# Patient Record
Sex: Male | Born: 1980 | Race: Black or African American | Hispanic: No | Marital: Married | Smoking: Never smoker
Health system: Southern US, Community
[De-identification: ages and names within clinical notes are randomized; demographics above are authoritative.]

---

## 2015-10-29 ENCOUNTER — Emergency Department (HOSPITAL_COMMUNITY)
Admission: EM | Admit: 2015-10-29 | Discharge: 2015-10-29 | Disposition: A | Discharge status: Home / Self Care | Payer: BLUE CROSS/BLUE SHIELD | Attending: Emergency Medicine | Admitting: Emergency Medicine

## 2015-10-29 ENCOUNTER — Encounter (HOSPITAL_COMMUNITY): Payer: Self-pay | Admitting: *Deleted

## 2015-10-29 DIAGNOSIS — Y9289 Other specified places as the place of occurrence of the external cause: Secondary | ICD-10-CM | POA: Insufficient documentation

## 2015-10-29 DIAGNOSIS — X501XXA Overexertion from prolonged static or awkward postures, initial encounter: Secondary | ICD-10-CM | POA: Diagnosis not present

## 2015-10-29 DIAGNOSIS — Y9389 Activity, other specified: Secondary | ICD-10-CM | POA: Insufficient documentation

## 2015-10-29 DIAGNOSIS — Y998 Other external cause status: Secondary | ICD-10-CM | POA: Diagnosis not present

## 2015-10-29 DIAGNOSIS — S3992XA Unspecified injury of lower back, initial encounter: Secondary | ICD-10-CM | POA: Insufficient documentation

## 2015-10-29 DIAGNOSIS — M549 Dorsalgia, unspecified: Secondary | ICD-10-CM

## 2015-10-29 MED ORDER — IBUPROFEN 800 MG PO TABS: 800.0000 mg | ORAL_TABLET | Freq: Three times a day (TID) | ORAL | Status: DC

## 2015-10-29 MED ORDER — IBUPROFEN 400 MG PO TABS
800.0000 mg | ORAL_TABLET | Freq: Once | ORAL | Status: AC
  Administered 2015-10-29: 800 mg via ORAL
  Filled 2015-10-29: qty 2

## 2015-10-29 MED ORDER — METHOCARBAMOL 500 MG PO TABS: 500.0000 mg | ORAL_TABLET | Freq: Two times a day (BID) | ORAL | Status: DC

## 2015-10-29 MED ORDER — METHOCARBAMOL 500 MG PO TABS
500.0000 mg | ORAL_TABLET | Freq: Once | ORAL | Status: AC
  Administered 2015-10-29: 500 mg via ORAL
  Filled 2015-10-29: qty 1

## 2015-10-29 NOTE — ED Notes (Signed)
Declined W/C at D/C and was escorted to lobby by RN. 

## 2015-10-29 NOTE — Discharge Instructions (Signed)
1. Medications: motrin, robaxin, usual home medications 2. Treatment: rest, drink plenty of fluids, use heating pads, do back exercises 3. Follow Up: please followup with your primary doctor this week for discussion of your diagnoses and further evaluation after today's visit; if you do not have a primary care doctor use the resource guide provided to find one; please return to the ER for increased pain, numbness, loss of control of your bowel or bladder, new or worsening symptoms    Back Pain, Adult Back pain is very common. The pain often gets better over time. The cause of back pain is usually not dangerous. Most people can learn to manage their back pain on their own.  HOME CARE  Watch your back pain for any changes. The following actions may help to lessen any pain you are feeling: 1. Stay active. Start with short walks on flat ground if you can. Try to walk farther each day. 2. Exercise regularly as told by your doctor. Exercise helps your back heal faster. It also helps avoid future injury by keeping your muscles strong and flexible. 3. Do not sit, drive, or stand in one place for more than 30 minutes. 4. Do not stay in bed. Resting more than 1-2 days can slow down your recovery. 5. Be careful when you bend or lift an object. Use good form when lifting: 1. Bend at your knees. 2. Keep the object close to your body. 3. Do not twist. 6. Sleep on a firm mattress. Lie on your side, and bend your knees. If you lie on your back, put a pillow under your knees. 7. Take medicines only as told by your doctor. 8. Put ice on the injured area. 1. Put ice in a plastic bag. 2. Place a towel between your skin and the bag. 3. Leave the ice on for 20 minutes, 2-3 times a day for the first 2-3 days. After that, you can switch between ice and heat packs. 9. Avoid feeling anxious or stressed. Find good ways to deal with stress, such as exercise. 10. Maintain a healthy weight. Extra weight puts stress on  your back. GET HELP IF:  1. You have pain that does not go away with rest or medicine. 2. You have worsening pain that goes down into your legs or buttocks. 3. You have pain that does not get better in one week. 4. You have pain at night. 5. You lose weight. 6. You have a fever or chills. GET HELP RIGHT AWAY IF:  1. You cannot control when you poop (bowel movement) or pee (urinate). 2. Your arms or legs feel weak. 3. Your arms or legs lose feeling (numbness). 4. You feel sick to your stomach (nauseous) or throw up (vomit). 5. You have belly (abdominal) pain. 6. You feel like you may pass out (faint).   This information is not intended to replace advice given to you by your health care provider. Make sure you discuss any questions you have with your health care provider.   Document Released: 02/04/2008 Document Revised: 09/08/2014 Document Reviewed: 12/20/2013 Elsevier Interactive Patient Education 2016 [employer]  Back Exercises If you have pain in your back, do these exercises 2-3 times each day or as told by your doctor. When the pain goes away, do the exercises once each day, but repeat the steps more times for each exercise (do more repetitions). If you do not have pain in your back, do these exercises once each day or as told by your  doctor. EXERCISES Single Knee to Chest Do these steps 3-5 times in a row for each leg: 11. Lie on your back on a firm bed or the floor with your legs stretched out. 12. Bring one knee to your chest. 13. Hold your knee to your chest by grabbing your knee or thigh. 14. Pull on your knee until you feel a gentle stretch in your lower back. 15. Keep doing the stretch for 10-30 seconds. 16. Slowly let go of your leg and straighten it. Pelvic Tilt Do these steps 5-10 times in a row: 7. Lie on your back on a firm bed or the floor with your legs stretched out. 8. Bend your knees so they point up to the ceiling. Your feet should be flat on the  floor. 9. Tighten your lower belly (abdomen) muscles to press your lower back against the floor. This will make your tailbone point up to the ceiling instead of pointing down to your feet or the floor. 10. Stay in this position for 5-10 seconds while you gently tighten your muscles and breathe evenly. Cat-Cow Do these steps until your lower back bends more easily: 7. Get on your hands and knees on a firm surface. Keep your hands under your shoulders, and keep your knees under your hips. You may put padding under your knees. 8. Let your head hang down, and make your tailbone point down to the floor so your lower back is round like the back of a cat. 9. Stay in this position for 5 seconds. 10. Slowly lift your head and make your tailbone point up to the ceiling so your back hangs low (sags) like the back of a cow. 11. Stay in this position for 5 seconds. Press-Ups Do these steps 5-10 times in a row: 1. Lie on your belly (face-down) on the floor. 2. Place your hands near your head, about shoulder-width apart. 3. While you keep your back relaxed and keep your hips on the floor, slowly straighten your arms to raise the top half of your body and lift your shoulders. Do not use your back muscles. To make yourself more comfortable, you may change where you place your hands. 4. Stay in this position for 5 seconds. 5. Slowly return to lying flat on the floor. Bridges Do these steps 10 times in a row: 1. Lie on your back on a firm surface. 2. Bend your knees so they point up to the ceiling. Your feet should be flat on the floor. 3. Tighten your butt muscles and lift your butt off of the floor until your waist is almost as high as your knees. If you do not feel the muscles working in your butt and the back of your thighs, slide your feet 1-2 inches farther away from your butt. 4. Stay in this position for 3-5 seconds. 5. Slowly lower your butt to the floor, and let your butt muscles relax. If this  exercise is too easy, try doing it with your arms crossed over your chest. Belly Crunches Do these steps 5-10 times in a row: 1. Lie on your back on a firm bed or the floor with your legs stretched out. 2. Bend your knees so they point up to the ceiling. Your feet should be flat on the floor. 3. Cross your arms over your chest. 4. Tip your chin a little bit toward your chest but do not bend your neck. 5. Tighten your belly muscles and slowly raise your chest just enough to lift  your shoulder blades a tiny bit off of the floor. 6. Slowly lower your chest and your head to the floor. Back Lifts Do these steps 5-10 times in a row: 1. Lie on your belly (face-down) with your arms at your sides, and rest your forehead on the floor. 2. Tighten the muscles in your legs and your butt. 3. Slowly lift your chest off of the floor while you keep your hips on the floor. Keep the back of your head in line with the curve in your back. Look at the floor while you do this. 4. Stay in this position for 3-5 seconds. 5. Slowly lower your chest and your face to the floor. GET HELP IF:  Your back pain gets a lot worse when you do an exercise.  Your back pain does not lessen 2 hours after you exercise. If you have any of these problems, stop doing the exercises. Do not do them again unless your doctor says it is okay. GET HELP RIGHT AWAY IF:  You have sudden, very bad back pain. If this happens, stop doing the exercises. Do not do them again unless your doctor says it is okay.   This information is not intended to replace advice given to you by your health care provider. Make sure you discuss any questions you have with your health care provider.   Document Released: 09/20/2010 Document Revised: 05/09/2015 Document Reviewed: 10/12/2014 Elsevier Interactive Patient Education [employer]

## 2015-10-29 NOTE — ED Provider Notes (Signed)
CSN: [id number]     Arrival date & time 10/29/15  1249 History  By signing my name below, I, Soijett Blue, attest that this documentation has been prepared under the direction and in the presence of Glean Hess, PA-C Electronically Signed: Soijett Blue, ED Scribe. 10/29/2015. 3:06 PM.   Chief Complaint  Patient presents with  . Back Pain     The history is provided by the patient. No language interpreter was used.    HPI Comments: Erik Adkins is a 35 y.o. male who presents to the Emergency Department complaining of 9/10, constant, sharp, non-radiating, right lower back pain onset 4 days. Pt notes that he attempted to pick his son up out of a bathtub when his pain started. Pt back pain is worsened with movement and ambulation. Pt denies any alleviating factors. Pt has had back issues in the past to the same area that was caused by a sports injury. Pt past back pain was treated with muscle relaxers. Pt notes that his current back pain episode is worse than his past back pain episode. He states that he is having associated symptoms of tingling to bilateral legs and generalized body aches. He states that he has tried ibuprofen with mild relief for his symptoms. Pt denies bowel/bladder incontinence, saddle anesthesia, numbness, weakness, abdominal pain, n/v, and any other symptoms. Denies PMHx of CA, blood thinner use, or IV drug use. Pt does have a PCP.     History reviewed. No pertinent past medical history. History reviewed. No pertinent past surgical history. No family history on file. Social History  Substance Use Topics  . Smoking status: Never Smoker   . Smokeless tobacco: None  . Alcohol Use: Yes     Comment: occ      Review of Systems  Gastrointestinal: Negative for nausea, vomiting and abdominal pain.       No bowel incontinence  Genitourinary:       No bladder incontinence  Musculoskeletal: Positive for myalgias, back pain and gait problem (due to pain).  Skin:  Negative for color change, rash and wound.  Neurological: Negative for weakness and numbness.       Tingling to bilateral legs      Allergies  Review of patient's allergies indicates no known allergies.  Home Medications   Prior to Admission medications   Medication Sig Start Date End Date Taking? Authorizing Provider  ibuprofen (ADVIL,MOTRIN) 800 MG tablet Take 1 tablet (800 mg total) by mouth 3 (three) times daily. 10/29/15   Mady Gemma, PA-C  methocarbamol (ROBAXIN) 500 MG tablet Take 1 tablet (500 mg total) by mouth 2 (two) times daily. 10/29/15   Mady Gemma, PA-C    BP 128/68 mmHg  Pulse 57  Temp(Src) 98.2 F (36.8 C) (Oral)  Resp 18  SpO2 99% Physical Exam  Constitutional: He is oriented to person, place, and time. He appears well-developed and well-nourished. No distress.  HENT:  Head: Normocephalic and atraumatic.  Right Ear: External ear normal.  Left Ear: External ear normal.  Nose: Nose normal.  Eyes: Conjunctivae and EOM are normal. Right eye exhibits no discharge. Left eye exhibits no discharge. No scleral icterus.  Neck: Normal range of motion. Neck supple.  Cardiovascular: Normal rate, regular rhythm, normal heart sounds and intact distal pulses.   Pulmonary/Chest: Effort normal and breath sounds normal. No respiratory distress. He has no wheezes. He has no rales. He exhibits no tenderness.  Abdominal: Soft. Bowel sounds are normal. He exhibits no distension  and no mass. There is no tenderness. There is no rebound and no guarding.  Musculoskeletal: Normal range of motion. He exhibits tenderness. He exhibits no edema.       Lumbar back: He exhibits tenderness. He exhibits no deformity.  TTP to right lumbar paraspinal muscles. No midline tenderness, step-off, or deformities.  Neurological: He is alert and oriented to person, place, and time. He has normal strength and normal reflexes. No sensory deficit. Gait normal.  Strength and sensation  intact. DTRs intact. Patient able to ambulate without difficulty or assistance.   Skin: Skin is warm and dry. He is not diaphoretic.  Psychiatric: He has a normal mood and affect. His behavior is normal.  Nursing note and vitals reviewed.   ED Course  Procedures (including critical care time)  DIAGNOSTIC STUDIES: Oxygen Saturation is 99% on RA, nl by my interpretation.    COORDINATION OF CARE: 3:05 PM Discussed treatment plan with pt at bedside which includes ibuprofen rx, muscle relaxer rx, and pt agreed to plan.   Labs Review Labs Reviewed - No data to display  Imaging Review No results found.    EKG Interpretation None      MDM   Final diagnoses:  Back pain, unspecified location    35 year old male presents with back pain after bending over to pick up his son. Reports tingling to lower extremities bilaterally. Denies numbness, weakness, bowel or bladder incontinence, saddle anesthesia, history of malignancy, IVDU, anticoagulant use.  Patient is afebrile. Vital signs stable. On exam, he has TTP to his right lumbar paraspinal muscles with palpable spasm. No midline tenderness, step-off, or deformity. Strength, sensation, DTRs intact. Patient ambulates without difficulty.  Do not feel imaging is indicated at this time. Doubt cauda equina, abscess, hematoma. Symptoms likely muscular. Will treat with ibuprofen and robaxin. Patient to follow-up with PCP. Strict return precautions discussed. Patient verbalizes his understanding and is in agreement with plan.  BP 128/68 mmHg  Pulse 57  Temp(Src) 98.2 F (36.8 C) (Oral)  Resp 18  SpO2 99%   I personally performed the services described in this documentation, which was scribed in my presence. The recorded information has been reviewed and is accurate.     Mady Gemma, PA-C 10/29/15 1521  Lyndal Pulley, MD 10/29/15 [phone]

## 2015-10-29 NOTE — ED Notes (Signed)
Pt states went to pick son up out of bathtub on Fri.  His legs gave out, but he was able to get up and has been experiencing constant back pain since then.  No loss of bowel or bladder habits.

## 2016-03-21 DIAGNOSIS — M545 Low back pain: Secondary | ICD-10-CM | POA: Diagnosis not present

## 2016-03-21 DIAGNOSIS — J020 Streptococcal pharyngitis: Secondary | ICD-10-CM | POA: Diagnosis not present

## 2016-07-01 ENCOUNTER — Encounter (HOSPITAL_COMMUNITY): Payer: Self-pay | Admitting: Emergency Medicine

## 2016-07-01 ENCOUNTER — Emergency Department (HOSPITAL_COMMUNITY)
Admission: EM | Admit: 2016-07-01 | Discharge: 2016-07-01 | Disposition: A | Discharge status: Home / Self Care | Payer: BLUE CROSS/BLUE SHIELD | Attending: Emergency Medicine | Admitting: Emergency Medicine

## 2016-07-01 ENCOUNTER — Emergency Department (HOSPITAL_COMMUNITY): Payer: BLUE CROSS/BLUE SHIELD

## 2016-07-01 DIAGNOSIS — R51 Headache: Secondary | ICD-10-CM | POA: Insufficient documentation

## 2016-07-01 DIAGNOSIS — M545 Low back pain, unspecified: Secondary | ICD-10-CM

## 2016-07-01 DIAGNOSIS — R519 Headache, unspecified: Secondary | ICD-10-CM

## 2016-07-01 LAB — URINALYSIS, ROUTINE W REFLEX MICROSCOPIC
Bilirubin Urine: NEGATIVE
GLUCOSE, UA: NEGATIVE mg/dL
Hgb urine dipstick: NEGATIVE
Ketones, ur: NEGATIVE mg/dL
Leukocytes, UA: NEGATIVE
Nitrite: NEGATIVE
PH: 7 (ref 5.0–8.0)
Protein, ur: NEGATIVE mg/dL
SPECIFIC GRAVITY, URINE: 1.016 (ref 1.005–1.030)

## 2016-07-01 IMAGING — CT CT ANGIO HEAD
1 of 13 series · 1 of 33 positions shown · IV contrast (Iohexol (Omnipaque 350))
Comparison: None.

CLINICAL DATA: Right-sided headache and photophobia acute onset.

EXAM:
CT ANGIOGRAPHY HEAD
TECHNIQUE: Multidetector CT imaging of the head was performed using the
standard protocol during bolus administration of intravenous
contrast. Multiplanar CT image reconstructions and MIPs were
obtained to evaluate the vascular anatomy.
CONTRAST:  50 cc Isovue 370

[Series 300: locator · axial · 0.49mm/px · 1 of 1 slices shown]
[im 1/1  soft-tissue]
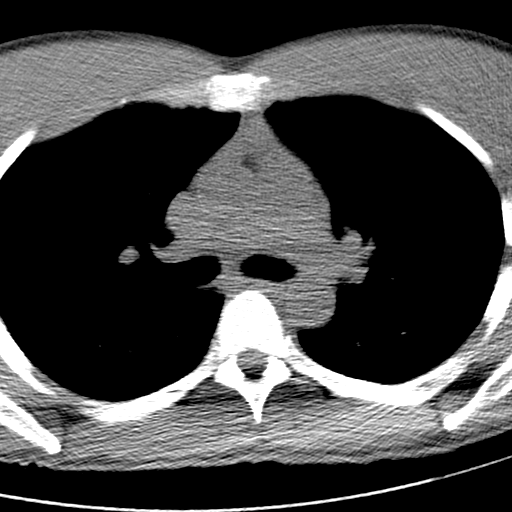

[1 of 33 positions shown; findings below may reference images not displayed]

FINDINGS: CT HEAD

Brain: No evidence of malformation, atrophy, old or acute small or
large vessel infarction, mass lesion, hemorrhage, hydrocephalus or
extra-axial collection. No evidence of pituitary lesion.

Vascular: No vascular calcification.  No hyperdense vessels.

Skull: Normal.  No fracture or focal bone lesion.

Sinuses/Orbits: Visualized sinuses are clear. No fluid in the middle
ears or mastoids. Visualized orbits are normal.

Other: None significant

CTA HEAD

Anterior circulation: Both internal carotid arteries are widely
patent through the skullbase and siphon regions. The anterior and
middle cerebral vessels are normal without stenosis, aneurysm or
vascular malformation.

Posterior circulation: Both vertebral arteries are patent to the
basilar. No basilar stenosis. Posterior circulation branch vessels
are normal.

Venous sinuses: Patent and normal.

Anatomic variants: None significant.

Delayed phase: No abnormal enhancement.
IMPRESSION: Normal CT of the head with and without contrast. No evidence of
intracranial hemorrhage.

Normal CT angiography of the intracranial vasculature.

## 2016-07-01 MED ORDER — PROCHLORPERAZINE EDISYLATE 5 MG/ML IJ SOLN
10.0000 mg | Freq: Once | INTRAMUSCULAR | Status: AC
  Administered 2016-07-01: 10 mg via INTRAVENOUS
  Filled 2016-07-01: qty 2

## 2016-07-01 MED ORDER — IOPAMIDOL (ISOVUE-370) INJECTION 76%
INTRAVENOUS | Status: AC
  Administered 2016-07-01: 50 mL
  Filled 2016-07-01: qty 100

## 2016-07-01 MED ORDER — METHOCARBAMOL 500 MG PO TABS: 500.0000 mg | ORAL_TABLET | Freq: Three times a day (TID) | ORAL | 0 refills | Status: DC | PRN

## 2016-07-01 MED ORDER — KETOROLAC TROMETHAMINE 30 MG/ML IJ SOLN
30.0000 mg | Freq: Once | INTRAMUSCULAR | Status: AC
  Administered 2016-07-01: 30 mg via INTRAVENOUS
  Filled 2016-07-01: qty 1

## 2016-07-01 MED ORDER — IBUPROFEN 800 MG PO TABS: 800.0000 mg | ORAL_TABLET | Freq: Three times a day (TID) | ORAL | 0 refills | Status: AC | PRN

## 2016-07-01 MED ORDER — DIPHENHYDRAMINE HCL 50 MG/ML IJ SOLN
25.0000 mg | Freq: Once | INTRAMUSCULAR | Status: AC
  Administered 2016-07-01: 25 mg via INTRAVENOUS
  Filled 2016-07-01: qty 1

## 2016-07-01 MED ORDER — SODIUM CHLORIDE 0.9 % IV BOLUS (SEPSIS)
1000.0000 mL | Freq: Once | INTRAVENOUS | Status: AC
  Administered 2016-07-01: 1000 mL via INTRAVENOUS

## 2016-07-01 MED ORDER — DEXAMETHASONE SODIUM PHOSPHATE 10 MG/ML IJ SOLN
10.0000 mg | Freq: Once | INTRAMUSCULAR | Status: AC
  Administered 2016-07-01: 10 mg via INTRAVENOUS
  Filled 2016-07-01: qty 1

## 2016-07-01 NOTE — ED Provider Notes (Signed)
[facility]-EMERGENCY DEPT Provider Note   CSN: [id number] Arrival date & time: 07/01/16  [id number]     History   Chief Complaint Chief Complaint  Patient presents with  . Back Pain  . Headache    HPI Erik BlanchBrian Kreischer is a 35 y.o. male.  35 year old male here with a couple weeks of intermittent headache since associated with photophobia. Also has had some right lower back pain. Has some dark and decreased urine over the last 24 hours. No fevers, nausea, vomiting. Has had a "stiff neck" which seems to be bilateral muscular with nothing midline. Has no trouble moving his neck. No trouble swallowing or breathing. No rashes, trauma or other associated symptoms. No modifying factors side from light make it worse. Has taken Motrin to some relief.      History reviewed. No pertinent past medical history.  There are no active problems to display for this patient.   History reviewed. No pertinent surgical history.     Home Medications    Prior to Admission medications   Medication Sig Start Date End Date Taking? Authorizing Provider  ibuprofen (ADVIL,MOTRIN) 800 MG tablet Take 1 tablet (800 mg total) by mouth every 8 (eight) hours as needed. 07/01/16   Marily MemosJason Keria Widrig, MD  methocarbamol (ROBAXIN) 500 MG tablet Take 1 tablet (500 mg total) by mouth every 8 (eight) hours as needed for muscle spasms. 07/01/16   Marily MemosJason Tatyana Biber, MD    Family History No family history on file.  Social History Social History  Substance Use Topics  . Smoking status: Never Smoker  . Smokeless tobacco: Not on file  . Alcohol use Yes     Comment: occ     Allergies   Review of patient's allergies indicates no known allergies.   Review of Systems Review of Systems  All other systems reviewed and are negative.    Physical Exam Updated Vital Signs BP 105/68   Pulse (!) 47   Temp 98.2 F (36.8 C) (Oral)   Resp 14   Ht 6\' 3"  (1.905 m)   Wt 202 lb (91.6 kg)   SpO2 100%   BMI 25.25 kg/m   Physical  Exam  Constitutional: He appears well-developed and well-nourished.  HENT:  Head: Normocephalic and atraumatic.  Eyes: Conjunctivae and EOM are normal.  Neck: Normal range of motion.  Cardiovascular: Normal rate.   Pulmonary/Chest: Effort normal. No respiratory distress. He exhibits no tenderness.  Abdominal: Soft. He exhibits no distension. There is no tenderness.  Musculoskeletal: Normal range of motion. He exhibits tenderness (mild right lumbar).  Neurological: He is alert.  No altered mental status, able to give full seemingly accurate history.  Face is symmetric, EOM's intact, pupils equal and reactive, vision intact, tongue and uvula midline without deviation Upper and Lower extremity motor 5/5, intact pain perception in distal extremities, 2+ reflexes in biceps, patella and achilles tendons. Finger to nose normal, heel to shin normal. Walks without assistance or evident ataxia.    Skin: Skin is warm and dry. No rash noted.  Nursing note and vitals reviewed.    ED Treatments / Results  Labs (all labs ordered are listed, but only abnormal results are displayed) Labs Reviewed  URINALYSIS, ROUTINE W REFLEX MICROSCOPIC [facility])    EKG  EKG Interpretation None       Radiology Ct Angio Head W Or Wo Contrast  Result Date: 07/01/2016 CLINICAL DATA:  Right-sided headache and photophobia acute onset. EXAM: CT ANGIOGRAPHY HEAD TECHNIQUE: Multidetector CT imaging of  the head was performed using the standard protocol during bolus administration of intravenous contrast. Multiplanar CT image reconstructions and MIPs were obtained to evaluate the vascular anatomy. CONTRAST:  50 cc Isovue 370 COMPARISON:  None. FINDINGS: CT HEAD Brain: No evidence of malformation, atrophy, old or acute small or large vessel infarction, mass lesion, hemorrhage, hydrocephalus or extra-axial collection. No evidence of pituitary lesion. Vascular: No vascular calcification.  No hyperdense vessels. Skull:  Normal.  No fracture or focal bone lesion. Sinuses/Orbits: Visualized sinuses are clear. No fluid in the middle ears or mastoids. Visualized orbits are normal. Other: None significant CTA HEAD Anterior circulation: Both internal carotid arteries are widely patent through the skullbase and siphon regions. The anterior and middle cerebral vessels are normal without stenosis, aneurysm or vascular malformation. Posterior circulation: Both vertebral arteries are patent to the basilar. No basilar stenosis. Posterior circulation branch vessels are normal. Venous sinuses: Patent and normal. Anatomic variants: None significant. Delayed phase: No abnormal enhancement. IMPRESSION: Normal CT of the head with and without contrast. No evidence of intracranial hemorrhage. Normal CT angiography of the intracranial vasculature. Electronically Signed   By: Paulina FusiMark  Shogry M.D.   On: 07/01/2016 09:36    Procedures Procedures (including critical care time)  Medications Ordered in ED Medications  sodium chloride 0.9 % bolus 1,000 mL (0 mLs Intravenous Stopped 07/01/16 1000)  diphenhydrAMINE (BENADRYL) injection 25 mg (25 mg Intravenous Given 07/01/16 0823)  prochlorperazine (COMPAZINE) injection 10 mg (10 mg Intravenous Given 07/01/16 0824)  ketorolac (TORADOL) 30 MG/ML injection 30 mg (30 mg Intravenous Given 07/01/16 0823)  dexamethasone (DECADRON) injection 10 mg (10 mg Intravenous Given 07/01/16 0824)  iopamidol (ISOVUE-370) 76 % injection (50 mLs  Contrast Given 07/01/16 0845)     Initial Impression / Assessment and Plan / ED Course  I have reviewed the triage vital signs and the nursing notes.  Pertinent labs & imaging results that were available during my care of the patient were reviewed by me and considered in my medical decision making (see chart for details).  Clinical Course   Likely migraines 2/2 stress/dehydration so will treat accordingly. However relatively new onset headaches and has a family  history of aneurysms so we'll get a CTA to ensure no aneurysm.  Labs and imaging unremarkable. Improved symptoms. Doubt meningitis, aneurysm or hemorrhage.   Final Clinical Impressions(s) / ED Diagnoses   Final diagnoses:  Right-sided low back pain without sciatica, unspecified chronicity  Nonintractable headache, unspecified chronicity pattern, unspecified headache type    New Prescriptions Discharge Medication List as of 07/01/2016 10:30 AM       Marily MemosJason Niaja Stickley, MD 07/01/16 [phone]

## 2016-07-01 NOTE — ED Triage Notes (Signed)
Pt reports headache and neck stiffness x2 weeks. Pt denies any fevers or n/v. Pt also reports lower back pain that began this morning, nad. Pt a/ox4.

## 2016-07-01 NOTE — ED Notes (Signed)
Patient transported to CT 

## 2016-08-20 ENCOUNTER — Other Ambulatory Visit (HOSPITAL_COMMUNITY): Payer: Self-pay | Admitting: Family Medicine

## 2016-08-20 DIAGNOSIS — M545 Low back pain: Secondary | ICD-10-CM

## 2016-08-27 ENCOUNTER — Ambulatory Visit (HOSPITAL_COMMUNITY): Admission: RE | Admit: 2016-08-27 | Payer: BLUE CROSS/BLUE SHIELD | Source: Ambulatory Visit

## 2017-05-18 DIAGNOSIS — S46812A Strain of other muscles, fascia and tendons at shoulder and upper arm level, left arm, initial encounter: Secondary | ICD-10-CM | POA: Diagnosis not present

## 2017-05-20 ENCOUNTER — Other Ambulatory Visit (HOSPITAL_COMMUNITY): Payer: Self-pay | Admitting: Family Medicine

## 2017-05-20 DIAGNOSIS — M545 Low back pain: Secondary | ICD-10-CM

## 2017-05-21 ENCOUNTER — Ambulatory Visit (HOSPITAL_COMMUNITY)
Admission: RE | Admit: 2017-05-21 | Discharge: 2017-05-21 | Disposition: A | Discharge status: Home / Self Care | Payer: BLUE CROSS/BLUE SHIELD | Source: Ambulatory Visit | Attending: Family Medicine | Admitting: Family Medicine

## 2017-05-21 DIAGNOSIS — M5137 Other intervertebral disc degeneration, lumbosacral region: Secondary | ICD-10-CM | POA: Diagnosis not present

## 2017-05-21 DIAGNOSIS — M5127 Other intervertebral disc displacement, lumbosacral region: Secondary | ICD-10-CM | POA: Diagnosis not present

## 2017-05-21 DIAGNOSIS — M545 Low back pain: Secondary | ICD-10-CM

## 2017-05-21 DIAGNOSIS — M5116 Intervertebral disc disorders with radiculopathy, lumbar region: Secondary | ICD-10-CM | POA: Diagnosis not present

## 2017-05-25 DIAGNOSIS — Z6825 Body mass index (BMI) 25.0-25.9, adult: Secondary | ICD-10-CM | POA: Diagnosis not present

## 2017-05-25 DIAGNOSIS — M549 Dorsalgia, unspecified: Secondary | ICD-10-CM | POA: Diagnosis not present

## 2018-02-23 DIAGNOSIS — J020 Streptococcal pharyngitis: Secondary | ICD-10-CM | POA: Diagnosis not present

## 2018-02-23 DIAGNOSIS — J029 Acute pharyngitis, unspecified: Secondary | ICD-10-CM | POA: Diagnosis not present

## 2018-08-19 DIAGNOSIS — J00 Acute nasopharyngitis [common cold]: Secondary | ICD-10-CM | POA: Diagnosis not present

## 2019-03-01 DIAGNOSIS — Z20828 Contact with and (suspected) exposure to other viral communicable diseases: Secondary | ICD-10-CM | POA: Diagnosis not present

## 2019-05-30 ENCOUNTER — Other Ambulatory Visit: Payer: Self-pay

## 2019-05-30 DIAGNOSIS — Z20822 Contact with and (suspected) exposure to covid-19: Secondary | ICD-10-CM

## 2019-05-31 LAB — NOVEL CORONAVIRUS, NAA: SARS-CoV-2, NAA: NOT DETECTED

## 2019-06-16 ENCOUNTER — Other Ambulatory Visit: Payer: Self-pay

## 2019-06-16 DIAGNOSIS — Z20822 Contact with and (suspected) exposure to covid-19: Secondary | ICD-10-CM

## 2019-06-18 LAB — NOVEL CORONAVIRUS, NAA: SARS-CoV-2, NAA: NOT DETECTED

## 2019-09-07 ENCOUNTER — Ambulatory Visit: Payer: BC Managed Care – PPO | Attending: Internal Medicine

## 2019-09-07 DIAGNOSIS — Z20822 Contact with and (suspected) exposure to covid-19: Secondary | ICD-10-CM | POA: Diagnosis not present

## 2019-09-09 LAB — NOVEL CORONAVIRUS, NAA: SARS-CoV-2, NAA: NOT DETECTED

## 2020-01-10 ENCOUNTER — Ambulatory Visit: Payer: BC Managed Care – PPO | Attending: Internal Medicine

## 2020-01-10 ENCOUNTER — Other Ambulatory Visit: Payer: BC Managed Care – PPO

## 2020-01-10 DIAGNOSIS — Z20822 Contact with and (suspected) exposure to covid-19: Secondary | ICD-10-CM | POA: Diagnosis not present

## 2020-09-19 DIAGNOSIS — Z1152 Encounter for screening for COVID-19: Secondary | ICD-10-CM | POA: Diagnosis not present

## 2021-07-31 ENCOUNTER — Other Ambulatory Visit: Payer: Self-pay

## 2021-07-31 ENCOUNTER — Encounter (HOSPITAL_COMMUNITY): Payer: Self-pay | Admitting: Emergency Medicine

## 2021-07-31 ENCOUNTER — Ambulatory Visit (HOSPITAL_COMMUNITY)
Admission: EM | Admit: 2021-07-31 | Discharge: 2021-07-31 | Disposition: A | Discharge status: Home / Self Care | Payer: BC Managed Care – PPO | Attending: Urgent Care | Admitting: Urgent Care

## 2021-07-31 DIAGNOSIS — R059 Cough, unspecified: Secondary | ICD-10-CM | POA: Insufficient documentation

## 2021-07-31 DIAGNOSIS — R079 Chest pain, unspecified: Secondary | ICD-10-CM | POA: Diagnosis not present

## 2021-07-31 DIAGNOSIS — J069 Acute upper respiratory infection, unspecified: Secondary | ICD-10-CM | POA: Diagnosis not present

## 2021-07-31 DIAGNOSIS — U071 COVID-19: Secondary | ICD-10-CM | POA: Insufficient documentation

## 2021-07-31 DIAGNOSIS — R0981 Nasal congestion: Secondary | ICD-10-CM

## 2021-07-31 LAB — SARS CORONAVIRUS 2 (TAT 6-24 HRS): SARS Coronavirus 2: POSITIVE — AB

## 2021-07-31 MED ORDER — BENZONATATE 100 MG PO CAPS: 100.0000 mg | ORAL_CAPSULE | Freq: Three times a day (TID) | ORAL | 0 refills | Status: DC | PRN

## 2021-07-31 MED ORDER — PSEUDOEPHEDRINE HCL 60 MG PO TABS: 60.0000 mg | ORAL_TABLET | Freq: Three times a day (TID) | ORAL | 0 refills | Status: DC | PRN

## 2021-07-31 MED ORDER — PROMETHAZINE-DM 6.25-15 MG/5ML PO SYRP: 5.0000 mL | ORAL_SOLUTION | Freq: Every evening | ORAL | 0 refills | Status: DC | PRN

## 2021-07-31 MED ORDER — IPRATROPIUM BROMIDE 0.03 % NA SOLN: 2.0000 | Freq: Two times a day (BID) | NASAL | 0 refills | Status: DC

## 2021-07-31 MED ORDER — CETIRIZINE HCL 10 MG PO TABS: 10.0000 mg | ORAL_TABLET | Freq: Every day | ORAL | 0 refills | Status: DC

## 2021-07-31 NOTE — ED Provider Notes (Signed)
[facility]   MRN: [id number] DOB: 1980-11-14  Subjective:   Erik Adkins is a 40 y.o. male presenting for 2-3 day history of sinus congestion, post-nasal drainage, sinus headaches. Has also had a cough that elicits chest pain. Has had body aches. He does get a little winded going up the stairs.  No fever, shob, wheezing. No smoking. No asthma.   No current facility-administered medications for this encounter.  Current Outpatient Medications:    ibuprofen (ADVIL,MOTRIN) 800 MG tablet, Take 1 tablet (800 mg total) by mouth every 8 (eight) hours as needed., Disp: 21 tablet, Rfl: 0   methocarbamol (ROBAXIN) 500 MG tablet, Take 1 tablet (500 mg total) by mouth every 8 (eight) hours as needed for muscle spasms., Disp: 20 tablet, Rfl: 0   No Known Allergies  History reviewed. No pertinent past medical history.   History reviewed. No pertinent surgical history.  No family history on file.  Social History   Tobacco Use   Smoking status: Never  Substance Use Topics   Alcohol use: Yes    Comment: occ   Drug use: No    ROS   Objective:   Vitals: BP 117/84 (BP Location: Right Arm)   Pulse 92   Temp 99.7 F (37.6 C) (Oral)   Resp 16   SpO2 98%   Physical Exam Constitutional:      General: He is not in acute distress.    Appearance: Normal appearance. He is well-developed. He is not ill-appearing, toxic-appearing or diaphoretic.  HENT:     Head: Normocephalic and atraumatic.     Right Ear: Tympanic membrane, ear canal and external ear normal. There is no impacted cerumen.     Left Ear: Tympanic membrane, ear canal and external ear normal. There is no impacted cerumen.     Nose: Congestion and rhinorrhea present.     Mouth/Throat:     Mouth: Mucous membranes are moist.     Pharynx: No oropharyngeal exudate or posterior oropharyngeal erythema.  Eyes:     General: No scleral icterus.       Right eye: No discharge.        Left eye: No discharge.      Extraocular Movements: Extraocular movements intact.     Conjunctiva/sclera: Conjunctivae normal.     Pupils: Pupils are equal, round, and reactive to light.  Cardiovascular:     Rate and Rhythm: Normal rate and regular rhythm.     Heart sounds: Normal heart sounds. No murmur heard.   No friction rub. No gallop.  Pulmonary:     Effort: Pulmonary effort is normal. No respiratory distress.     Breath sounds: Normal breath sounds. No stridor. No wheezing, rhonchi or rales.  Neurological:     Mental Status: He is alert and oriented to person, place, and time.  Psychiatric:        Mood and Affect: Mood normal.        Behavior: Behavior normal.        Thought Content: Thought content normal.    Assessment and Plan :   PDMP not reviewed this encounter.  1. Viral URI with cough   2. Nasal congestion    COVID and flu test, resp panel pending.  We will otherwise manage for viral upper respiratory infection.  Physical exam findings reassuring and vital signs stable for discharge. Advised supportive care, offered symptomatic relief. Deferred imaging given clear cardiopulmonary exam, hemodynamically stable vital signs.  Patient would really  benefit from Tamiflu given the timeline of his illness and low risk factors for complications.  Counseled patient on potential for adverse effects with medications prescribed/recommended today, ER and return-to-clinic precautions discussed, patient verbalized understanding.      Wallis Bamberg, [place] 07/31/21 [phone]

## 2021-07-31 NOTE — Discharge Instructions (Addendum)

## 2021-07-31 NOTE — ED Triage Notes (Signed)
Pt having cough, congestion since Monday.

## 2021-08-01 LAB — RESPIRATORY PANEL BY PCR

## 2022-10-01 DIAGNOSIS — R52 Pain, unspecified: Secondary | ICD-10-CM | POA: Diagnosis not present

## 2022-10-01 DIAGNOSIS — Z03818 Encounter for observation for suspected exposure to other biological agents ruled out: Secondary | ICD-10-CM | POA: Diagnosis not present

## 2022-10-01 DIAGNOSIS — R051 Acute cough: Secondary | ICD-10-CM | POA: Diagnosis not present

## 2022-10-01 DIAGNOSIS — B349 Viral infection, unspecified: Secondary | ICD-10-CM | POA: Diagnosis not present

## 2022-10-01 DIAGNOSIS — R6883 Chills (without fever): Secondary | ICD-10-CM | POA: Diagnosis not present

## 2023-02-13 ENCOUNTER — Encounter (HOSPITAL_COMMUNITY): Payer: Self-pay | Admitting: Emergency Medicine

## 2023-02-13 ENCOUNTER — Ambulatory Visit (HOSPITAL_COMMUNITY)
Admission: EM | Admit: 2023-02-13 | Discharge: 2023-02-13 | Disposition: A | Discharge status: Home / Self Care | Payer: BC Managed Care – PPO | Attending: Emergency Medicine | Admitting: Emergency Medicine

## 2023-02-13 DIAGNOSIS — H1032 Unspecified acute conjunctivitis, left eye: Secondary | ICD-10-CM

## 2023-02-13 DIAGNOSIS — H5789 Other specified disorders of eye and adnexa: Secondary | ICD-10-CM

## 2023-02-13 MED ORDER — EYE WASH OP SOLN
OPHTHALMIC | Status: AC
  Filled 2023-02-13: qty 118

## 2023-02-13 MED ORDER — MOXIFLOXACIN HCL 0.5 % OP SOLN: 1.0000 [drp] | Freq: Three times a day (TID) | OPHTHALMIC | 0 refills | Status: AC

## 2023-02-13 MED ORDER — TETRACAINE HCL 0.5 % OP SOLN
OPHTHALMIC | Status: AC
  Filled 2023-02-13: qty 4

## 2023-02-13 MED ORDER — FLUORESCEIN SODIUM 1 MG OP STRP
ORAL_STRIP | OPHTHALMIC | Status: AC
  Filled 2023-02-13: qty 1

## 2023-02-13 NOTE — ED Provider Notes (Signed)
[facility]    CSN: [id number] Arrival date & time: 02/13/23  1807      History   Chief Complaint Chief Complaint  Patient presents with   Eye Pain    HPI Romulus Crafts is a 42 y.o. male.  2-3 day history of left eye pain, foreign body sensation  Pain worse with blinking  Reports mucous drainage  No known injury or trauma. No loss of vision Has tried ice and heat  Does not wear contacts   History reviewed. No pertinent past medical history.  There are no problems to display for this patient.   History reviewed. No pertinent surgical history.    Home Medications    Prior to Admission medications   Medication Sig Start Date End Date Taking? Authorizing Provider  moxifloxacin (VIGAMOX) 0.5 % ophthalmic solution Place 1 drop into the left eye 3 (three) times daily. 02/13/23  Yes Jaclyne Haverstick, Lurena Joiner, PA-C  ibuprofen (ADVIL,MOTRIN) 800 MG tablet Take 1 tablet (800 mg total) by mouth every 8 (eight) hours as needed. 07/01/16   Mesner, Barbara Cower, MD    Family History No family history on file.  Social History Social History   Tobacco Use   Smoking status: Never  Substance Use Topics   Alcohol use: Yes    Comment: occ   Drug use: No     Allergies   Patient has no known allergies.   Review of Systems Review of Systems  Eyes:  Positive for pain.   As per HPI  Physical Exam Triage Vital Signs ED Triage Vitals  Enc Vitals Group     BP 02/13/23 1858 133/88     Pulse Rate 02/13/23 1858 72     Resp 02/13/23 1858 15     Temp 02/13/23 1858 98.5 F (36.9 C)     Temp Source 02/13/23 1858 Oral     SpO2 02/13/23 1858 97 %     Weight --      Height --      Head Circumference --      Peak Flow --      Pain Score 02/13/23 1857 8     Pain Loc --      Pain Edu? --      Excl. in GC? --    No data found.  Updated Vital Signs BP 133/88 (BP Location: Left Arm)   Pulse 72   Temp 98.5 F (36.9 C) (Oral)   Resp 15   SpO2 97%   Physical Exam Vitals  and nursing note reviewed.  Constitutional:      General: He is not in acute distress. HENT:     Mouth/Throat:     Pharynx: Oropharynx is clear.  Eyes:     General: Lids are everted, no foreign bodies appreciated. Vision grossly intact. Gaze aligned appropriately.        Left eye: No foreign body, discharge or hordeolum.     Extraocular Movements: Extraocular movements intact.     Left eye: Normal extraocular motion and no nystagmus.     Conjunctiva/sclera: Conjunctivae normal.     Left eye: Left conjunctiva is not injected. No exudate.    Pupils: Pupils are equal, round, and reactive to light.     Left eye: No fluorescein uptake.     Comments: Mild swelling of left upper lid. Tender on lateral aspect. There is no foreign body noted. No uptake on fluorescin exam. No obvious stye.   Cardiovascular:     Rate and Rhythm:  Normal rate and regular rhythm.  Pulmonary:     Effort: Pulmonary effort is normal.  Neurological:     Mental Status: He is alert and oriented to person, place, and time.     UC Treatments / Results  Labs (all labs ordered are listed, but only abnormal results are displayed) Labs Reviewed - No data to display  EKG   Radiology No results found.  Procedures Procedures   Medications Ordered in UC Medications - No data to display  Initial Impression / Assessment and Plan / UC Course  I have reviewed the triage vital signs and the nursing notes.  Pertinent labs & imaging results that were available during my care of the patient were reviewed by me and considered in my medical decision making (see chart for details).  Left eye irritation, possible conjunctivitis No sign of foreign body, ulcer or abrasion. Unknown etiology but patient feeling better with tetracaine and eye rinse.  No discharge on exam but patient reports some green mucous. Will cover for bacterial infection with vigamox TID. Eye rinse and warm compress at home. Advised follow up with eye  specialist. Return precautions.   Final Clinical Impressions(s) / UC Diagnoses   Final diagnoses:  Irritation of left eye  Acute bacterial conjunctivitis of left eye     Discharge Instructions      Eye drops used three times daily for the next 5-7 days. In the left eye only  You can use the eye rinse for comfort as well Apply warm compress to eyelid, 10-15 minutes a a time, several times daily Gentle massage of eyelid  Please follow with eye specialist if needed     ED Prescriptions     Medication Sig Dispense Auth. Provider   moxifloxacin (VIGAMOX) 0.5 % ophthalmic solution Place 1 drop into the left eye 3 (three) times daily. 3 mL Joliana Claflin, Lurena Joiner, PA-C      PDMP not reviewed this encounter.   Nyleah Mcginnis, Ray Church 02/13/23 1935

## 2023-02-13 NOTE — Discharge Instructions (Addendum)
Eye drops used three times daily for the next 5-7 days. In the left eye only  You can use the eye rinse for comfort as well Apply warm compress to eyelid, 10-15 minutes a a time, several times daily Gentle massage of eyelid  Please follow with eye specialist if needed

## 2023-02-13 NOTE — ED Triage Notes (Signed)
Pt reports left eye swelling, drainage and pain since Wed. Tried heat

## 2023-05-26 DIAGNOSIS — R42 Dizziness and giddiness: Secondary | ICD-10-CM | POA: Diagnosis not present

## 2023-05-26 DIAGNOSIS — I10 Essential (primary) hypertension: Secondary | ICD-10-CM | POA: Diagnosis not present

## 2023-05-26 DIAGNOSIS — G4700 Insomnia, unspecified: Secondary | ICD-10-CM | POA: Diagnosis not present
